# Patient Record
Sex: Male | Born: 2000 | Race: Black or African American | Hispanic: No | Marital: Single | Smoking: Never smoker
Health system: Southern US, Community
[De-identification: ages and names within clinical notes are randomized; demographics above are authoritative.]

---

## 2018-08-18 ENCOUNTER — Emergency Department (HOSPITAL_COMMUNITY): Payer: BC Managed Care – PPO

## 2018-08-18 ENCOUNTER — Encounter (HOSPITAL_COMMUNITY): Payer: Self-pay | Admitting: Emergency Medicine

## 2018-08-18 ENCOUNTER — Emergency Department (HOSPITAL_COMMUNITY)
Admission: EM | Admit: 2018-08-18 | Discharge: 2018-08-18 | Disposition: A | Discharge status: Home / Self Care | Payer: BC Managed Care – PPO | Attending: Emergency Medicine | Admitting: Emergency Medicine

## 2018-08-18 DIAGNOSIS — Y92410 Unspecified street and highway as the place of occurrence of the external cause: Secondary | ICD-10-CM | POA: Diagnosis not present

## 2018-08-18 DIAGNOSIS — Y939 Activity, unspecified: Secondary | ICD-10-CM | POA: Insufficient documentation

## 2018-08-18 DIAGNOSIS — M25511 Pain in right shoulder: Secondary | ICD-10-CM | POA: Diagnosis not present

## 2018-08-18 DIAGNOSIS — Y999 Unspecified external cause status: Secondary | ICD-10-CM | POA: Insufficient documentation

## 2018-08-18 MED ORDER — METHOCARBAMOL 500 MG PO TABS: 500.0000 mg | ORAL_TABLET | Freq: Two times a day (BID) | ORAL | 0 refills | Status: AC | PRN

## 2018-08-18 MED ORDER — IBUPROFEN 200 MG PO TABS
600.0000 mg | ORAL_TABLET | Freq: Once | ORAL | Status: AC
  Administered 2018-08-18: 600 mg via ORAL
  Filled 2018-08-18: qty 3

## 2018-08-18 MED ORDER — IBUPROFEN 600 MG PO TABS: 600.0000 mg | ORAL_TABLET | Freq: Four times a day (QID) | ORAL | 0 refills | Status: AC | PRN
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
## 2019-08-14 IMAGING — CR DG SHOULDER 2+V*R*
3 series · 3 of 3 positions shown · non-contrast
Comparison: None.

CLINICAL DATA: Right shoulder pain.

EXAM:
RIGHT SHOULDER - 2+ VIEW

[w shoulder external right]
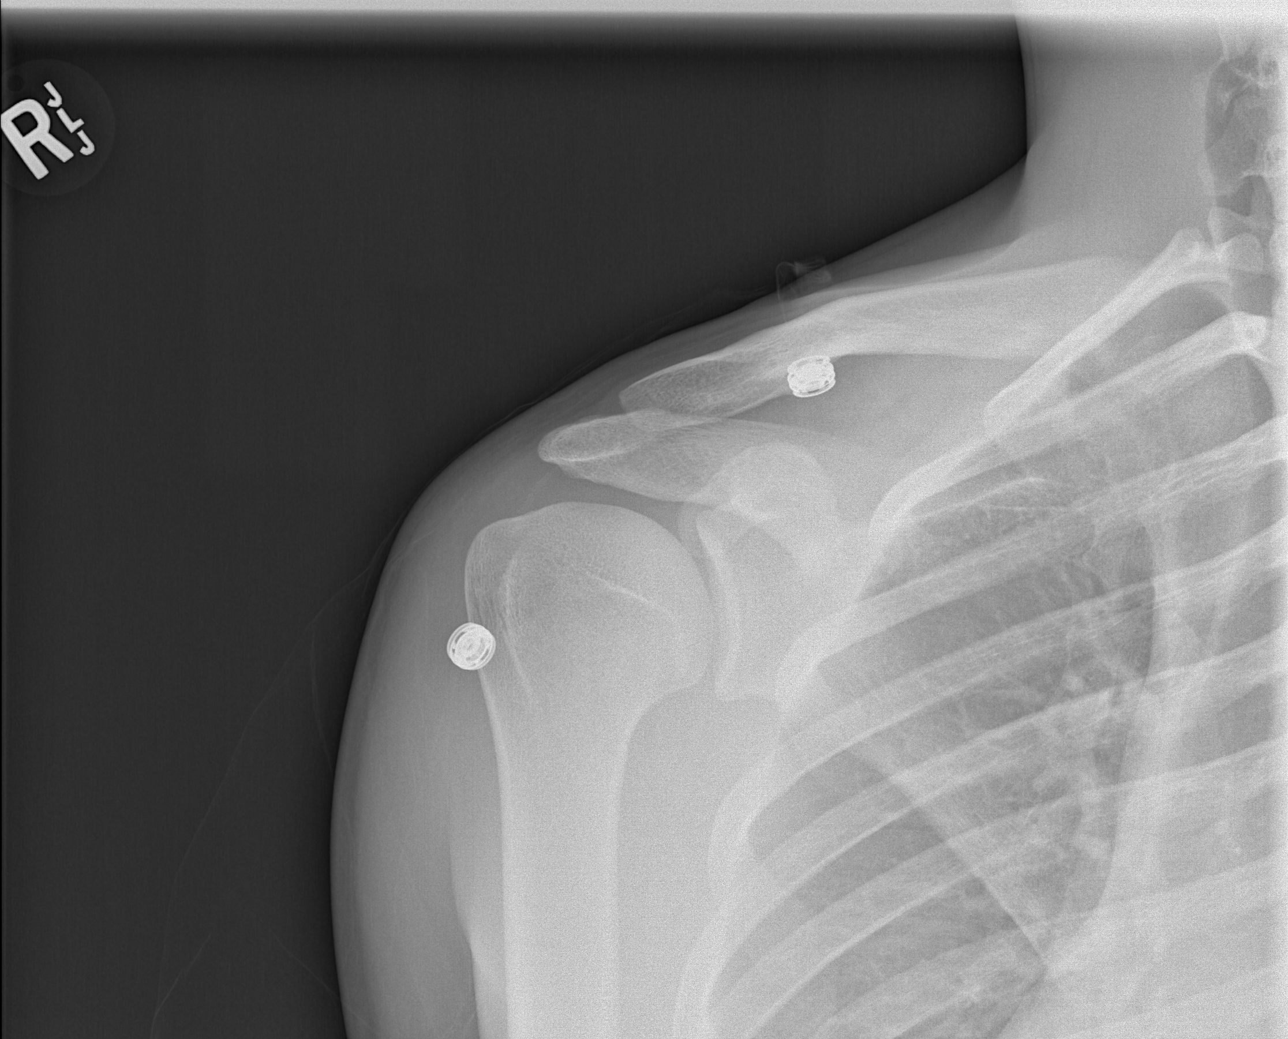

[w shoulder y-view right]
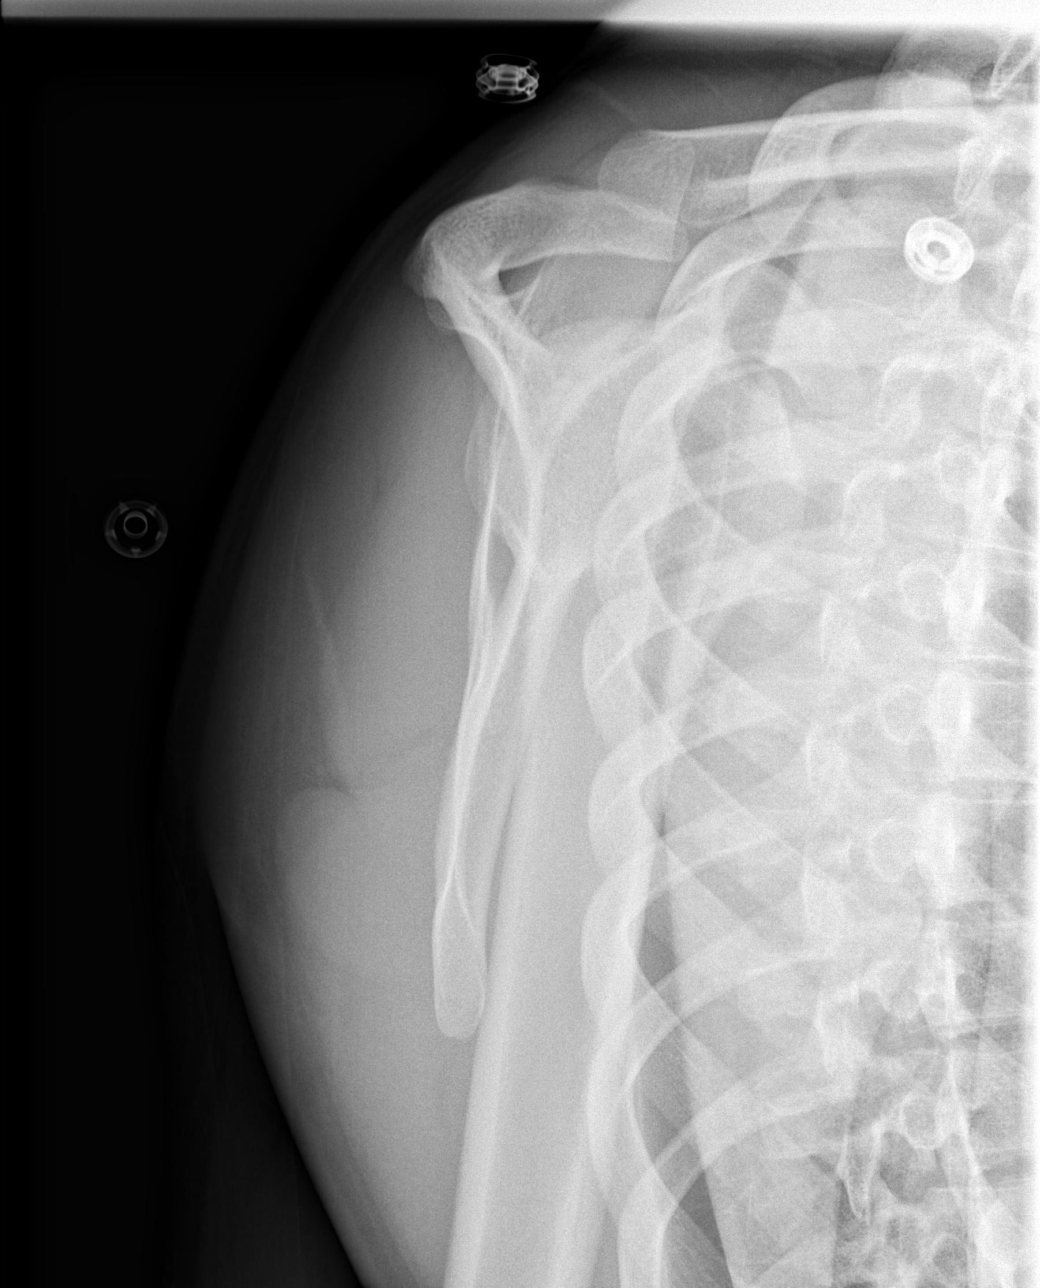

[x shoulder axillary right]
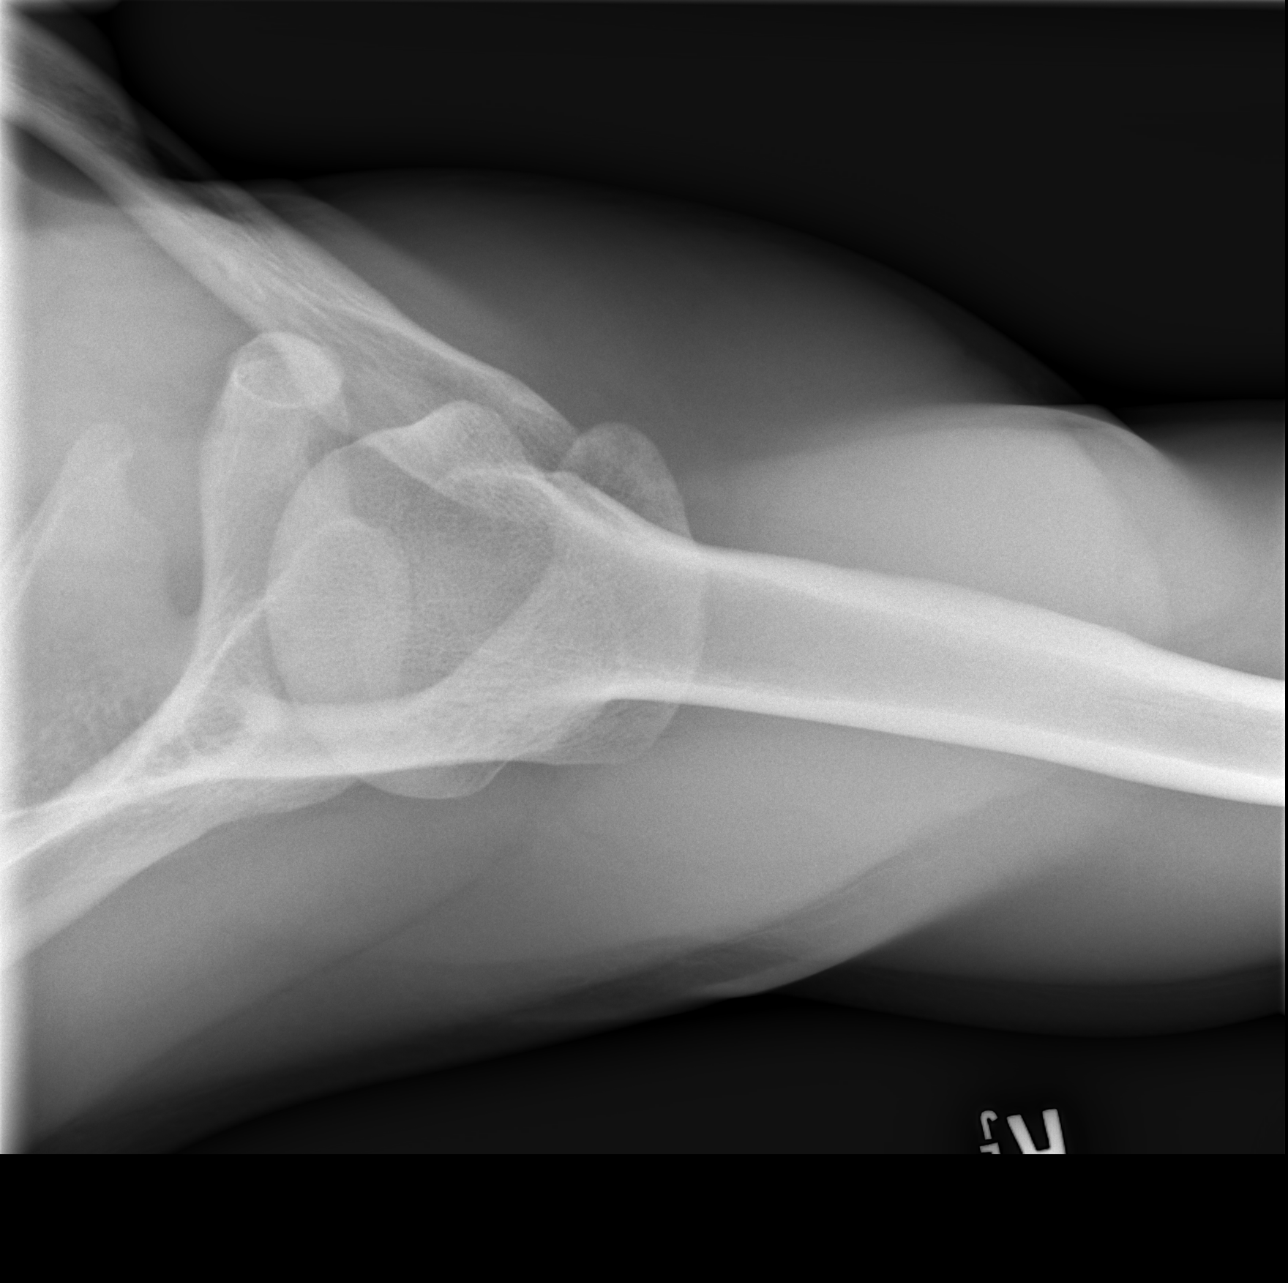

[3 of 3 positions shown; findings below may reference images not displayed]

FINDINGS: There is no evidence of fracture or dislocation. There is no
evidence of arthropathy or other focal bone abnormality. Soft
tissues are unremarkable.
IMPRESSION: No acute osseous injury of the right shoulder.

## 2020-05-30 ENCOUNTER — Other Ambulatory Visit: Payer: Self-pay

## 2020-05-30 ENCOUNTER — Other Ambulatory Visit: Payer: BC Managed Care – PPO

## 2020-05-30 DIAGNOSIS — Z20822 Contact with and (suspected) exposure to covid-19: Secondary | ICD-10-CM

## 2020-06-01 LAB — SARS-COV-2, NAA 2 DAY TAT

## 2020-06-01 LAB — NOVEL CORONAVIRUS, NAA: SARS-CoV-2, NAA: NOT DETECTED

## 2020-08-20 ENCOUNTER — Ambulatory Visit: Payer: BC Managed Care – PPO | Attending: Family

## 2020-08-20 DIAGNOSIS — Z23 Encounter for immunization: Secondary | ICD-10-CM

## 2020-11-21 NOTE — Progress Notes (Signed)
   Covid-19 Vaccination [facility]  Name:  Dan Morse    MRN: [id number] DOB: 08-11-2001  11/21/2020  Dan Morse was observed post Covid-19 immunization for 15 minutes without incident. He was provided with Vaccine Information Sheet and instruction to access the V-Safe system.   Dan Morse was instructed to call 911 with any severe reactions post vaccine: Marland Kitchen Difficulty breathing  . Swelling of face and throat  . A fast heartbeat  . A bad rash all over body  . Dizziness and weakness   Immunizations Administered    Name Date Dose VIS Date Route   Moderna Covid-19 Booster Vaccine 08/20/2020 11:40 AM 0.25 mL 07/23/2020 Intramuscular   Manufacturer: Moderna   Lot: [id number]   NDC: 77939-030-09
